# Patient Record
Sex: Female | Born: 1965 | Race: Black or African American | Hispanic: No | Marital: Married | Smoking: Never smoker
Health system: Southern US, Community
[De-identification: ages and names within clinical notes are randomized; demographics above are authoritative.]

## PROBLEM LIST (undated history)

## (undated) DIAGNOSIS — Z973 Presence of spectacles and contact lenses: Secondary | ICD-10-CM

## (undated) HISTORY — PX: WISDOM TOOTH EXTRACTION: SHX21

## (undated) HISTORY — PX: TUBAL LIGATION: SHX77

---

## 2000-06-29 ENCOUNTER — Other Ambulatory Visit: Admission: RE | Admit: 2000-06-29 | Discharge: 2000-06-29 | Payer: Self-pay | Admitting: Obstetrics and Gynecology

## 2002-10-31 ENCOUNTER — Other Ambulatory Visit: Admission: RE | Admit: 2002-10-31 | Discharge: 2002-10-31 | Payer: Self-pay | Admitting: Internal Medicine

## 2005-06-23 ENCOUNTER — Other Ambulatory Visit: Admission: RE | Admit: 2005-06-23 | Discharge: 2005-06-23 | Payer: Self-pay | Admitting: Obstetrics and Gynecology

## 2007-11-06 ENCOUNTER — Emergency Department (HOSPITAL_COMMUNITY): Admission: EM | Admit: 2007-11-06 | Discharge: 2007-11-06 | Payer: Self-pay | Admitting: Emergency Medicine

## 2010-07-19 ENCOUNTER — Encounter: Admission: RE | Admit: 2010-07-19 | Discharge: 2010-07-19 | Payer: Self-pay | Admitting: Family Medicine

## 2010-09-05 ENCOUNTER — Ambulatory Visit (HOSPITAL_COMMUNITY)
Admission: RE | Admit: 2010-09-05 | Discharge: 2010-09-05 | Payer: Self-pay | Source: Home / Self Care | Attending: Chiropractic Medicine | Admitting: Chiropractic Medicine
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
## 2012-02-12 ENCOUNTER — Other Ambulatory Visit: Payer: Self-pay

## 2013-09-20 ENCOUNTER — Encounter (INDEPENDENT_AMBULATORY_CARE_PROVIDER_SITE_OTHER): Payer: Self-pay | Admitting: General Surgery

## 2013-09-20 ENCOUNTER — Encounter (INDEPENDENT_AMBULATORY_CARE_PROVIDER_SITE_OTHER): Payer: Self-pay

## 2013-09-20 ENCOUNTER — Ambulatory Visit (INDEPENDENT_AMBULATORY_CARE_PROVIDER_SITE_OTHER): Payer: 59 | Admitting: General Surgery

## 2013-09-20 VITALS — BP 136/76 | HR 81 | Temp 99.0°F | Resp 18 | Ht 68.0 in | Wt 176.0 lb

## 2013-09-20 DIAGNOSIS — N6310 Unspecified lump in the right breast, unspecified quadrant: Secondary | ICD-10-CM

## 2013-09-20 DIAGNOSIS — N63 Unspecified lump in unspecified breast: Secondary | ICD-10-CM

## 2013-09-20 NOTE — Progress Notes (Signed)
Patient ID: Alexis Gonzales, female   DOB: Jul 05, 1966, 48 y.o.   MRN: [id number]  Chief Complaint  Patient presents with  . New Evaluation    rt breast    HPI Alexis Gonzales is a 48 y.o. female.  Referred by Dr Antonietta Jewel HPI This is a 48 year old female with a significant family history of breast cancer in her mom in her late 62s. In 2013 she was noted to have a right breast mass on her own exam. This was evaluated at that point in time and by ultrasound was 2.5 cm. An ultrasound-guided core needle biopsy was performed at that time. That pathology was benign breast parenchyma with dense hyalinizing stromal fibrosis with no malignancy that was identified. She was then followed up in is noted that this has increased in size on her own exam. Her last ultrasound in January shows this went from 2.2 x 1.2 x 2.5 cm to 3 x 1.5 x 3.8 cm. She has no other complaints except for this mass and comes in to discuss excision today. History reviewed. No pertinent past medical history.  History reviewed. No pertinent past surgical history.  Family History  Problem Relation Age of Onset  . Cancer Mother     breast ca  . Heart disease Father   . Cancer Paternal Uncle     throat ca    Social History History  Substance Use Topics  . Smoking status: Never Smoker   . Smokeless tobacco: Not on file  . Alcohol Use: No    No Known Allergies  No current outpatient prescriptions on file.   No current facility-administered medications for this visit.    Review of Systems Review of Systems  Constitutional: Negative for fever, chills and unexpected weight change.  HENT: Negative for congestion, hearing loss, sore throat, trouble swallowing and voice change.   Eyes: Negative for visual disturbance.  Respiratory: Negative for cough and wheezing.   Cardiovascular: Negative for chest pain, palpitations and leg swelling.  Gastrointestinal: Negative for nausea, vomiting, abdominal pain, diarrhea,  constipation, blood in stool, abdominal distention and anal bleeding.  Genitourinary: Negative for hematuria, vaginal bleeding and difficulty urinating.  Musculoskeletal: Negative for arthralgias.  Skin: Negative for rash and wound.  Neurological: Negative for seizures, syncope and headaches.  Hematological: Negative for adenopathy. Does not bruise/bleed easily.  Psychiatric/Behavioral: Negative for confusion.    Blood pressure 136/76, pulse 81, temperature 99 F (37.2 C), resp. rate 18, height 5\' 8"  (1.727 m), weight 176 lb (79.833 kg), last menstrual period 09/13/2013.  Physical Exam Physical Exam  Vitals reviewed. Constitutional: She appears well-developed and well-nourished.  Neck: Neck supple.  Cardiovascular: Normal rate, regular rhythm and normal heart sounds.   Pulmonary/Chest: Effort normal and breath sounds normal. She has no wheezes. She has no rales. Right breast exhibits mass. Right breast exhibits no inverted nipple, no nipple discharge, no skin change and no tenderness. Left breast exhibits no inverted nipple, no mass, no nipple discharge, no skin change and no tenderness.    Lymphadenopathy:    She has no cervical adenopathy.    She has no axillary adenopathy.       Right: No supraclavicular adenopathy present.       Left: No supraclavicular adenopathy present.    Data Reviewed Mm/us/path reviewed from [facility]  Assessment    Right breast mass     Plan    Right breast mass excision via periareaolar incision  I recommended excision due to her family history  as well as an enlarging right breast mass despite a benign biopsy. I recommended to her proceeding with an excision of this mass. We discussed the risks and benefits associated with that. We're to proceed with this as soon as we can.        Joseluis Alessio 09/20/2013, 3:26 PM

## 2013-09-22 ENCOUNTER — Encounter (HOSPITAL_BASED_OUTPATIENT_CLINIC_OR_DEPARTMENT_OTHER): Payer: Self-pay | Admitting: *Deleted

## 2013-09-22 NOTE — Progress Notes (Signed)
No labs needed

## 2013-09-23 ENCOUNTER — Encounter (INDEPENDENT_AMBULATORY_CARE_PROVIDER_SITE_OTHER): Payer: Self-pay

## 2013-09-26 ENCOUNTER — Telehealth (INDEPENDENT_AMBULATORY_CARE_PROVIDER_SITE_OTHER): Payer: Self-pay

## 2013-09-26 NOTE — Telephone Encounter (Signed)
Error

## 2013-09-27 ENCOUNTER — Other Ambulatory Visit (INDEPENDENT_AMBULATORY_CARE_PROVIDER_SITE_OTHER): Payer: Self-pay | Admitting: General Surgery

## 2013-09-27 ENCOUNTER — Encounter (HOSPITAL_BASED_OUTPATIENT_CLINIC_OR_DEPARTMENT_OTHER): Payer: Self-pay | Admitting: *Deleted

## 2013-09-27 ENCOUNTER — Encounter (HOSPITAL_BASED_OUTPATIENT_CLINIC_OR_DEPARTMENT_OTHER): Payer: 59 | Admitting: Anesthesiology

## 2013-09-27 ENCOUNTER — Ambulatory Visit (HOSPITAL_BASED_OUTPATIENT_CLINIC_OR_DEPARTMENT_OTHER): Payer: 59 | Admitting: Anesthesiology

## 2013-09-27 ENCOUNTER — Encounter (HOSPITAL_BASED_OUTPATIENT_CLINIC_OR_DEPARTMENT_OTHER)
Admission: RE | Disposition: A | Discharge status: Home / Self Care | Payer: Self-pay | Source: Ambulatory Visit | Attending: General Surgery

## 2013-09-27 ENCOUNTER — Ambulatory Visit (HOSPITAL_BASED_OUTPATIENT_CLINIC_OR_DEPARTMENT_OTHER)
Admission: RE | Admit: 2013-09-27 | Discharge: 2013-09-27 | Disposition: A | Discharge status: Home / Self Care | Payer: 59 | Source: Ambulatory Visit | Attending: General Surgery | Admitting: General Surgery

## 2013-09-27 DIAGNOSIS — R920 Mammographic microcalcification found on diagnostic imaging of breast: Secondary | ICD-10-CM

## 2013-09-27 DIAGNOSIS — N63 Unspecified lump in unspecified breast: Secondary | ICD-10-CM | POA: Insufficient documentation

## 2013-09-27 HISTORY — DX: Presence of spectacles and contact lenses: Z97.3

## 2013-09-27 HISTORY — PX: BREAST BIOPSY: SHX20

## 2013-09-27 LAB — POCT HEMOGLOBIN-HEMACUE: Hemoglobin: 12.2 g/dL (ref 12.0–15.0)

## 2013-09-27 SURGERY — BREAST BIOPSY
Anesthesia: General | Site: Breast | Laterality: Right

## 2013-09-27 MED ORDER — ONDANSETRON HCL 4 MG/2ML IJ SOLN
INTRAMUSCULAR | Status: DC | PRN
  Administered 2013-09-27: 4 mg via INTRAVENOUS

## 2013-09-27 MED ORDER — OXYCODONE HCL 5 MG PO TABS
5.0000 mg | ORAL_TABLET | Freq: Once | ORAL | Status: AC | PRN
Start: 2013-09-27 — End: 2013-09-27
  Administered 2013-09-27: 5 mg via ORAL
  Filled 2013-09-27: qty 1

## 2013-09-27 MED ORDER — HYDROMORPHONE HCL PF 1 MG/ML IJ SOLN
0.2500 mg | INTRAMUSCULAR | Status: DC | PRN
Start: 2013-09-27 — End: 2013-09-27
  Administered 2013-09-27: 0.5 mg via INTRAVENOUS

## 2013-09-27 MED ORDER — MIDAZOLAM HCL 2 MG/2ML IJ SOLN: 1.0000 mg | INTRAMUSCULAR | Status: DC | PRN

## 2013-09-27 MED ORDER — LACTATED RINGERS IV SOLN
INTRAVENOUS | Status: DC
  Administered 2013-09-27 (×2): via INTRAVENOUS

## 2013-09-27 MED ORDER — MIDAZOLAM HCL 2 MG/2ML IJ SOLN
INTRAMUSCULAR | Status: AC
  Filled 2013-09-27: qty 2

## 2013-09-27 MED ORDER — LIDOCAINE HCL (CARDIAC) 20 MG/ML IV SOLN
INTRAVENOUS | Status: DC | PRN
  Administered 2013-09-27: 100 mg via INTRAVENOUS

## 2013-09-27 MED ORDER — FENTANYL CITRATE 0.05 MG/ML IJ SOLN: 50.0000 ug | INTRAMUSCULAR | Status: DC | PRN

## 2013-09-27 MED ORDER — PROMETHAZINE HCL 25 MG/ML IJ SOLN
6.2500 mg | INTRAMUSCULAR | Status: DC | PRN
Start: 2013-09-27 — End: 2013-09-27

## 2013-09-27 MED ORDER — MIDAZOLAM HCL 5 MG/5ML IJ SOLN
INTRAMUSCULAR | Status: DC | PRN
  Administered 2013-09-27: 2 mg via INTRAVENOUS

## 2013-09-27 MED ORDER — CEFAZOLIN SODIUM-DEXTROSE 2-3 GM-% IV SOLR
INTRAVENOUS | Status: AC
  Filled 2013-09-27: qty 50

## 2013-09-27 MED ORDER — HYDROMORPHONE HCL PF 1 MG/ML IJ SOLN
INTRAMUSCULAR | Status: AC
  Filled 2013-09-27: qty 1

## 2013-09-27 MED ORDER — PROPOFOL 10 MG/ML IV BOLUS
INTRAVENOUS | Status: AC
  Filled 2013-09-27: qty 20

## 2013-09-27 MED ORDER — OXYCODONE-ACETAMINOPHEN 10-325 MG PO TABS: 1.0000 | ORAL_TABLET | Freq: Four times a day (QID) | ORAL | Status: AC | PRN

## 2013-09-27 MED ORDER — FENTANYL CITRATE 0.05 MG/ML IJ SOLN
INTRAMUSCULAR | Status: AC
  Filled 2013-09-27: qty 4

## 2013-09-27 MED ORDER — DEXAMETHASONE SODIUM PHOSPHATE 4 MG/ML IJ SOLN
INTRAMUSCULAR | Status: DC | PRN
  Administered 2013-09-27: 10 mg via INTRAVENOUS

## 2013-09-27 MED ORDER — BUPIVACAINE HCL (PF) 0.25 % IJ SOLN
INTRAMUSCULAR | Status: DC | PRN
  Administered 2013-09-27: 10 mL

## 2013-09-27 MED ORDER — CEFAZOLIN SODIUM-DEXTROSE 2-3 GM-% IV SOLR
2.0000 g | INTRAVENOUS | Status: AC
  Administered 2013-09-27: 2 g via INTRAVENOUS

## 2013-09-27 MED ORDER — FENTANYL CITRATE 0.05 MG/ML IJ SOLN: 50.0000 ug | Freq: Once | INTRAMUSCULAR | Status: DC

## 2013-09-27 MED ORDER — OXYCODONE HCL 5 MG/5ML PO SOLN: 5.0000 mg | Freq: Once | ORAL | Status: AC | PRN

## 2013-09-27 MED ORDER — FENTANYL CITRATE 0.05 MG/ML IJ SOLN
INTRAMUSCULAR | Status: DC | PRN
  Administered 2013-09-27: 50 ug via INTRAVENOUS

## 2013-09-27 MED ORDER — PROPOFOL 10 MG/ML IV BOLUS
INTRAVENOUS | Status: DC | PRN
  Administered 2013-09-27: 180 mg via INTRAVENOUS

## 2013-09-27 SURGICAL SUPPLY — 49 items
APPLIER CLIP 9.375 MED OPEN (MISCELLANEOUS)
BENZOIN TINCTURE PRP APPL 2/3 (GAUZE/BANDAGES/DRESSINGS) IMPLANT
BINDER BREAST LRG (GAUZE/BANDAGES/DRESSINGS) ×2 IMPLANT
BINDER BREAST MEDIUM (GAUZE/BANDAGES/DRESSINGS) IMPLANT
BINDER BREAST XLRG (GAUZE/BANDAGES/DRESSINGS) IMPLANT
BINDER BREAST XXLRG (GAUZE/BANDAGES/DRESSINGS) IMPLANT
BLADE SURG 15 STRL LF DISP TIS (BLADE) ×1 IMPLANT
BLADE SURG 15 STRL SS (BLADE) ×1
CANISTER SUCT 1200ML W/VALVE (MISCELLANEOUS) IMPLANT
CHLORAPREP W/TINT 26ML (MISCELLANEOUS) ×2 IMPLANT
CLIP APPLIE 9.375 MED OPEN (MISCELLANEOUS) IMPLANT
COVER MAYO STAND STRL (DRAPES) ×2 IMPLANT
COVER TABLE BACK 60X90 (DRAPES) ×2 IMPLANT
DECANTER SPIKE VIAL GLASS SM (MISCELLANEOUS) IMPLANT
DERMABOND ADVANCED (GAUZE/BANDAGES/DRESSINGS) ×1
DERMABOND ADVANCED .7 DNX12 (GAUZE/BANDAGES/DRESSINGS) ×1 IMPLANT
DEVICE DUBIN W/COMP PLATE 8390 (MISCELLANEOUS) IMPLANT
DRAPE PED LAPAROTOMY (DRAPES) ×2 IMPLANT
DRSG TEGADERM 4X4.75 (GAUZE/BANDAGES/DRESSINGS) ×2 IMPLANT
ELECT COATED BLADE 2.86 ST (ELECTRODE) ×2 IMPLANT
ELECT REM PT RETURN 9FT ADLT (ELECTROSURGICAL) ×2
ELECTRODE REM PT RTRN 9FT ADLT (ELECTROSURGICAL) ×1 IMPLANT
GLOVE BIO SURGEON STRL SZ7 (GLOVE) ×2 IMPLANT
GLOVE BIOGEL PI IND STRL 7.5 (GLOVE) ×1 IMPLANT
GLOVE BIOGEL PI INDICATOR 7.5 (GLOVE) ×1
GOWN STRL REUS W/ TWL LRG LVL3 (GOWN DISPOSABLE) ×2 IMPLANT
GOWN STRL REUS W/TWL LRG LVL3 (GOWN DISPOSABLE) ×2
NEEDLE HYPO 25X1 1.5 SAFETY (NEEDLE) ×2 IMPLANT
NS IRRIG 1000ML POUR BTL (IV SOLUTION) IMPLANT
PACK BASIN DAY SURGERY FS (CUSTOM PROCEDURE TRAY) ×2 IMPLANT
PENCIL BUTTON HOLSTER BLD 10FT (ELECTRODE) ×2 IMPLANT
SLEEVE SCD COMPRESS KNEE MED (MISCELLANEOUS) ×2 IMPLANT
SPONGE GAUZE 4X4 12PLY STER LF (GAUZE/BANDAGES/DRESSINGS) ×2 IMPLANT
SPONGE LAP 4X18 X RAY DECT (DISPOSABLE) ×2 IMPLANT
STRIP CLOSURE SKIN 1/2X4 (GAUZE/BANDAGES/DRESSINGS) ×2 IMPLANT
SUT MNCRL AB 3-0 PS2 18 (SUTURE) IMPLANT
SUT MNCRL AB 4-0 PS2 18 (SUTURE) IMPLANT
SUT SILK 2 0 SH (SUTURE) ×2 IMPLANT
SUT VIC AB 2-0 SH 27 (SUTURE) ×1
SUT VIC AB 2-0 SH 27XBRD (SUTURE) ×1 IMPLANT
SUT VIC AB 3-0 SH 27 (SUTURE) ×1
SUT VIC AB 3-0 SH 27X BRD (SUTURE) ×1 IMPLANT
SUT VIC AB 5-0 PS2 18 (SUTURE) IMPLANT
SUT VICRYL AB 3 0 TIES (SUTURE) IMPLANT
SYR CONTROL 10ML LL (SYRINGE) ×2 IMPLANT
TOWEL OR 17X24 6PK STRL BLUE (TOWEL DISPOSABLE) ×2 IMPLANT
TOWEL OR NON WOVEN STRL DISP B (DISPOSABLE) ×2 IMPLANT
TUBE CONNECTING 20X1/4 (TUBING) IMPLANT
YANKAUER SUCT BULB TIP NO VENT (SUCTIONS) IMPLANT

## 2013-09-27 NOTE — Interval H&P Note (Signed)
History and Physical Interval Note:  09/27/2013 1:59 PM  Alexis Gonzales  has presented today for surgery, with the diagnosis of RIGHT BREAST MASS  The various methods of treatment have been discussed with the patient and family. After consideration of risks, benefits and other options for treatment, the patient has consented to  Procedure(s): RIGHT BREAST MASS EXCISION (Right) as a surgical intervention .  The patient's history has been reviewed, patient examined, no change in status, stable for surgery.  I have reviewed the patient's chart and labs.  Questions were answered to the patient's satisfaction.     [name]

## 2013-09-27 NOTE — Discharge Instructions (Signed)
[facility] °Office Phone Number [phone] ° °BREAST BIOPSY/ PARTIAL MASTECTOMY: POST OP INSTRUCTIONS ° °Always review your discharge instruction sheet given to you by the facility where your surgery was performed. ° °IF YOU HAVE DISABILITY OR FAMILY LEAVE FORMS, YOU MUST BRING THEM TO THE OFFICE FOR PROCESSING.  DO NOT GIVE THEM TO YOUR DOCTOR. ° °1. A prescription for pain medication may be given to you upon discharge.  Take your pain medication as prescribed, if needed.  If narcotic pain medicine is not needed, then you may take acetaminophen (Tylenol), naprosyn (Alleve) or ibuprofen (Advil) as needed. °2. Take your usually prescribed medications unless otherwise directed °3. If you need a refill on your pain medication, please contact your pharmacy.  They will contact our office to request authorization.  Prescriptions will not be filled after 5pm or on week-ends. °4. You should eat very light the first 24 hours after surgery, such as soup, crackers, pudding, etc.  Resume your normal diet the day after surgery. °5. Most patients will experience some swelling and bruising in the breast.  Ice packs and a good support bra will help.  Wear the breast binder provided or a sports bra for 72 hours day and night.  After that wear a sports bra during the day until you return to the office. Swelling and bruising can take several days to resolve.  °6. It is common to experience some constipation if taking pain medication after surgery.  Increasing fluid intake and taking a stool softener will usually help or prevent this problem from occurring.  A mild laxative (Milk of Magnesia or Miralax) should be taken according to package directions if there are no bowel movements after 48 hours. °7. Unless discharge instructions indicate otherwise, you may remove your bandages 48 hours after surgery and you may shower at that time.  You may have steri-strips (small skin tapes) in place directly over the incision.   These strips should be left on the skin for 7-10 days and will come off on their own.  If your surgeon used skin glue on the incision, you may shower in 24 hours.  The glue will flake off over the next 2-3 weeks.  Any sutures or staples will be removed at the office during your follow-up visit. °8. ACTIVITIES:  You may resume regular daily activities (gradually increasing) beginning the next day.  Wearing a good support bra or sports bra minimizes pain and swelling.  You may have sexual intercourse when it is comfortable. °a. You may drive when you no longer are taking prescription pain medication, you can comfortably wear a seatbelt, and you can safely maneuver your car and apply brakes. °b. RETURN TO WORK:  ______________________________________________________________________________________ °9. You should see your doctor in the office for a follow-up appointment approximately two weeks after your surgery.  Your doctor’s nurse will typically make your follow-up appointment when she calls you with your pathology report.  Expect your pathology report 3-4 business days after your surgery.  You may call to check if you do not hear from us after three days. °10. OTHER INSTRUCTIONS: _______________________________________________________________________________________________ _____________________________________________________________________________________________________________________________________ °_____________________________________________________________________________________________________________________________________ °_____________________________________________________________________________________________________________________________________ ° °WHEN TO CALL DR WAKEFIELD: °1. Fever over 101.0 °2. Nausea and/or vomiting. °3. Extreme swelling or bruising. °4. Continued bleeding from incision. °5. Increased pain, redness, or drainage from the incision. ° °The clinic staff is available to  answer your questions during regular business hours.  Please don’t hesitate to call and ask to speak to one of the nurses for   clinical concerns.  If you have a medical emergency, go to the nearest emergency room or call 911.  A surgeon from [facility] is always on call at the hospital. ° °For further questions, please visit [email or web address] ° °Post Anesthesia Home Care Instructions ° °Activity: °Get plenty of rest for the remainder of the day. A responsible adult should stay with you for 24 hours following the procedure.  °For the next 24 hours, DO NOT: °-Drive a car °-Operate machinery °-Drink alcoholic beverages °-Take any medication unless instructed by your physician °-Make any legal decisions or sign important papers. ° °Meals: °Start with liquid foods such as gelatin or soup. Progress to regular foods as tolerated. Avoid greasy, spicy, heavy foods. If nausea and/or vomiting occur, drink only clear liquids until the nausea and/or vomiting subsides. Call your physician if vomiting continues. ° °Special Instructions/Symptoms: °Your throat may feel dry or sore from the anesthesia or the breathing tube placed in your throat during surgery. If this causes discomfort, gargle with warm salt water. The discomfort should disappear within 24 hours. ° °

## 2013-09-27 NOTE — Anesthesia Preprocedure Evaluation (Signed)
Anesthesia Evaluation  Patient identified by MRN, date of birth, ID band Patient awake    Reviewed: Allergy & Precautions, H&P , NPO status , Patient's Chart, lab work & pertinent test results  Airway Mallampati: I TM Distance: >3 FB     Dental   Pulmonary  breath sounds clear to auscultation        Cardiovascular Rhythm:Regular Rate:Normal     Neuro/Psych    GI/Hepatic   Endo/Other    Renal/GU      Musculoskeletal   Abdominal   Peds  Hematology   Anesthesia Other Findings   Reproductive/Obstetrics                           Anesthesia Physical Anesthesia Plan  ASA: I  Anesthesia Plan: General   Post-op Pain Management:    Induction: Intravenous  Airway Management Planned: LMA  Additional Equipment:   Intra-op Plan:   Post-operative Plan: Extubation in OR  Informed Consent: I have reviewed the patients History and Physical, chart, labs and discussed the procedure including the risks, benefits and alternatives for the proposed anesthesia with the patient or authorized representative who has indicated his/her understanding and acceptance.     Plan Discussed with: CRNA and Surgeon  Anesthesia Plan Comments:         Anesthesia Quick Evaluation

## 2013-09-27 NOTE — H&P (View-Only) (Signed)
Patient ID: Alexis Gonzales, female   DOB: Jul 05, 1966, 48 y.o.   MRN: [id number]  Chief Complaint  Patient presents with  . New Evaluation    rt breast    HPI Alexis Gonzales is a 48 y.o. female.  Referred by Dr Antonietta Jewel HPI This is a 48 year old female with a significant family history of breast cancer in her mom in her late 62s. In 2013 she was noted to have a right breast mass on her own exam. This was evaluated at that point in time and by ultrasound was 2.5 cm. An ultrasound-guided core needle biopsy was performed at that time. That pathology was benign breast parenchyma with dense hyalinizing stromal fibrosis with no malignancy that was identified. She was then followed up in is noted that this has increased in size on her own exam. Her last ultrasound in January shows this went from 2.2 x 1.2 x 2.5 cm to 3 x 1.5 x 3.8 cm. She has no other complaints except for this mass and comes in to discuss excision today. History reviewed. No pertinent past medical history.  History reviewed. No pertinent past surgical history.  Family History  Problem Relation Age of Onset  . Cancer Mother     breast ca  . Heart disease Father   . Cancer Paternal Uncle     throat ca    Social History History  Substance Use Topics  . Smoking status: Never Smoker   . Smokeless tobacco: Not on file  . Alcohol Use: No    No Known Allergies  No current outpatient prescriptions on file.   No current facility-administered medications for this visit.    Review of Systems Review of Systems  Constitutional: Negative for fever, chills and unexpected weight change.  HENT: Negative for congestion, hearing loss, sore throat, trouble swallowing and voice change.   Eyes: Negative for visual disturbance.  Respiratory: Negative for cough and wheezing.   Cardiovascular: Negative for chest pain, palpitations and leg swelling.  Gastrointestinal: Negative for nausea, vomiting, abdominal pain, diarrhea,  constipation, blood in stool, abdominal distention and anal bleeding.  Genitourinary: Negative for hematuria, vaginal bleeding and difficulty urinating.  Musculoskeletal: Negative for arthralgias.  Skin: Negative for rash and wound.  Neurological: Negative for seizures, syncope and headaches.  Hematological: Negative for adenopathy. Does not bruise/bleed easily.  Psychiatric/Behavioral: Negative for confusion.    Blood pressure 136/76, pulse 81, temperature 99 F (37.2 C), resp. rate 18, height 5\' 8"  (1.727 m), weight 176 lb (79.833 kg), last menstrual period 09/13/2013.  Physical Exam Physical Exam  Vitals reviewed. Constitutional: She appears well-developed and well-nourished.  Neck: Neck supple.  Cardiovascular: Normal rate, regular rhythm and normal heart sounds.   Pulmonary/Chest: Effort normal and breath sounds normal. She has no wheezes. She has no rales. Right breast exhibits mass. Right breast exhibits no inverted nipple, no nipple discharge, no skin change and no tenderness. Left breast exhibits no inverted nipple, no mass, no nipple discharge, no skin change and no tenderness.    Lymphadenopathy:    She has no cervical adenopathy.    She has no axillary adenopathy.       Right: No supraclavicular adenopathy present.       Left: No supraclavicular adenopathy present.    Data Reviewed Mm/us/path reviewed from [facility]  Assessment    Right breast mass     Plan    Right breast mass excision via periareaolar incision  I recommended excision due to her family history  as well as an enlarging right breast mass despite a benign biopsy. I recommended to her proceeding with an excision of this mass. We discussed the risks and benefits associated with that. We're to proceed with this as soon as we can.        Rashawn Rayman 09/20/2013, 3:26 PM

## 2013-09-27 NOTE — Transfer of Care (Signed)
Immediate Anesthesia Transfer of Care Note  Patient: Alexis Gonzales  Procedure(s) Performed: Procedure(s): RIGHT BREAST MASS EXCISION (Right)  Patient Location: PACU  Anesthesia Type:General  Level of Consciousness: awake and alert   Airway & Oxygen Therapy: Patient Spontanous Breathing and Patient connected to face mask oxygen  Post-op Assessment: Report given to PACU RN and Post -op Vital signs reviewed and stable  Post vital signs: Reviewed and stable  Complications: No apparent anesthesia complications

## 2013-09-27 NOTE — Anesthesia Postprocedure Evaluation (Signed)
  Anesthesia Post-op Note  Patient: Alexis Gonzales  Procedure(s) Performed: Procedure(s): RIGHT BREAST MASS EXCISION (Right)  Patient Location: PACU  Anesthesia Type:General  Level of Consciousness: awake  Airway and Oxygen Therapy: Patient Spontanous Breathing  Post-op Pain: mild  Post-op Assessment: Post-op Vital signs reviewed, Patient's Cardiovascular Status Stable, Respiratory Function Stable, Patent Airway, No signs of Nausea or vomiting and Pain level controlled  Post-op Vital Signs: Reviewed and stable  Complications: No apparent anesthesia complications

## 2013-09-27 NOTE — Anesthesia Procedure Notes (Signed)
Procedure Name: LMA Insertion Date/Time: 09/27/2013 2:17 PM Performed by: Gar GibbonKEETON, Aneliese Beaudry S Pre-anesthesia Checklist: Patient identified, Emergency Drugs available, Suction available and Patient being monitored Patient Re-evaluated:Patient Re-evaluated prior to inductionOxygen Delivery Method: Circle System Utilized Preoxygenation: Pre-oxygenation with 100% oxygen Intubation Type: IV induction Ventilation: Mask ventilation without difficulty LMA: LMA inserted LMA Size: 4.0 Number of attempts: 1 Airway Equipment and Method: bite block Placement Confirmation: positive ETCO2 Tube secured with: Tape Dental Injury: Teeth and Oropharynx as per pre-operative assessment

## 2013-09-27 NOTE — Op Note (Signed)
Preoperative diagnosis: right breast mass, enlarging Postoperative diagnosis: same as above Procedure: right breast mass excision Surgeon: Alexis Gonzales Anesthesia: general Estimated blood loss: Minimal Complications: None Drains: None Specimens: Right breast tissue more short stitch superior, long stitch lateral, double stitch deep Disposition to recovery stable Sponge and needle count correct at completion  Indication: This is a [age] yof with an enlarging right breast mass. This has enlarged by ultrasound as well as by clinical exam. She has a prior benign biopsy but we discussed excision due to its enlargement.  Procedure: After informed consent was obtained the patient was taken to the operating room. She was given cefazolin. Sequential compression devices were on her legs. She was placed under general anesthesia with an LMA. Her right breast was prepped and draped in the standard sterile surgical fashion. A surgical timeout was then performed.  I made a perareolar incision. I used cautery to dissect up towards the mass. 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 This separated from the surrounding breast tissue fairly easily. I then removed the mass in total. I then marked this with sutures as above. Hemostasis was then obtained. I then closed the breast tissue a 2-0 Vicryl. The dermis was closed with 3-0 Vicryl. The skin was closed with 5-0 Monocryl. Quarter percent Marcaine was infiltrated throughout the area. I then placed Dermabond and Steri-Strips. She tolerated this well was extubated and transferred to recovery stable.

## 2013-09-29 ENCOUNTER — Encounter (HOSPITAL_BASED_OUTPATIENT_CLINIC_OR_DEPARTMENT_OTHER): Payer: Self-pay | Admitting: General Surgery

## 2013-09-29 ENCOUNTER — Encounter (INDEPENDENT_AMBULATORY_CARE_PROVIDER_SITE_OTHER): Payer: Self-pay | Admitting: General Surgery

## 2013-09-30 ENCOUNTER — Telehealth (INDEPENDENT_AMBULATORY_CARE_PROVIDER_SITE_OTHER): Payer: Self-pay

## 2013-09-30 NOTE — Telephone Encounter (Signed)
Called pt to notify her the path report is benign per Dr Dwain SarnaWakefield. I made the pt a f/u appt with Dr Dwain SarnaWakefield for 10/18/13. The pt understands.

## 2013-10-18 ENCOUNTER — Encounter (INDEPENDENT_AMBULATORY_CARE_PROVIDER_SITE_OTHER): Payer: Self-pay | Admitting: General Surgery

## 2013-10-18 ENCOUNTER — Ambulatory Visit (INDEPENDENT_AMBULATORY_CARE_PROVIDER_SITE_OTHER): Payer: 59 | Admitting: General Surgery

## 2013-10-18 DIAGNOSIS — Z09 Encounter for follow-up examination after completed treatment for conditions other than malignant neoplasm: Secondary | ICD-10-CM

## 2013-10-18 NOTE — Patient Instructions (Signed)

## 2013-10-18 NOTE — Progress Notes (Signed)
Subjective:     Patient ID: Alexis Gonzales, female   DOB: 12-24-1965, 48 y.o.   MRN: [id number]  HPI [age] yof who had palpable breast mass that underwent excision recently. She has done well and has no complaints today.  The biopsy is benign, no atypia or malignancy.  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    Review of Systems     Objective:   Physical Exam Healing right breast periareolar incision without infection    Assessment:     S/p benign right breast biopsy    Plan:     She can return to full activity.  We discussed pathology. She will continue monthly self exams, yearly mm and yearly clinical exam.  I will see her back as needed

## 2014-05-26 ENCOUNTER — Encounter (HOSPITAL_COMMUNITY): Payer: Self-pay | Admitting: Emergency Medicine

## 2014-05-26 ENCOUNTER — Emergency Department (HOSPITAL_COMMUNITY)
Admission: EM | Admit: 2014-05-26 | Discharge: 2014-05-26 | Disposition: A | Discharge status: Home / Self Care | Payer: 59 | Source: Home / Self Care | Attending: Family Medicine | Admitting: Family Medicine

## 2014-05-26 DIAGNOSIS — N390 Urinary tract infection, site not specified: Secondary | ICD-10-CM

## 2014-05-26 LAB — POCT URINALYSIS DIP (DEVICE)
Bilirubin Urine: NEGATIVE
Glucose, UA: NEGATIVE mg/dL
Ketones, ur: NEGATIVE mg/dL
Nitrite: POSITIVE — AB
Protein, ur: 100 mg/dL — AB
Specific Gravity, Urine: 1.015 (ref 1.005–1.030)
Urobilinogen, UA: 1 mg/dL (ref 0.0–1.0)
pH: 8.5 — ABNORMAL HIGH (ref 5.0–8.0)

## 2014-05-26 MED ORDER — SULFAMETHOXAZOLE-TRIMETHOPRIM 800-160 MG PO TABS: 1.0000 | ORAL_TABLET | Freq: Two times a day (BID) | ORAL | Status: DC

## 2014-05-26 NOTE — ED Provider Notes (Signed)
CSN: [id number]     Arrival date & time 05/26/14  1552 History   First MD Initiated Contact with Patient 05/26/14 1646     Chief Complaint  Patient presents with  . Urinary Tract Infection   (Consider location/radiation/quality/duration/timing/severity/associated sxs/prior Treatment) HPI Comments: 24 hours of urinary frequency and suprapubic pressure. No vaginal bleeding, discharge, fever, N/V, flank pain or abdominal pain.  Patient is a 48 y.o. female presenting with urinary tract infection. The history is provided by the patient.  Urinary Tract Infection This is a new problem. The current episode started yesterday. The problem occurs constantly. The problem has not changed since onset.   Past Medical History  Diagnosis Date  . Wears glasses    Past Surgical History  Procedure Laterality Date  . Wisdom tooth extraction    . Tubal ligation    . Breast biopsy Right 09/27/2013    Procedure: RIGHT BREAST MASS EXCISION;  Surgeon: Emelia Loron, MD;  Location: [facility];  Service: General;  Laterality: Right;  . Tubal ligation     Family History  Problem Relation Age of Onset  . Cancer Mother     breast ca  . Heart disease Father   . Cancer Paternal Uncle     throat ca   History  Substance Use Topics  . Smoking status: Never Smoker   . Smokeless tobacco: Not on file  . Alcohol Use: Yes     Comment: occ   OB History   Grav Para Term Preterm Abortions TAB SAB Ect Mult Living                 Review of Systems  All other systems reviewed and are negative.   Allergies  Review of patient's allergies indicates no known allergies.  Home Medications   Prior to Admission medications   Medication Sig Start Date End Date Taking? Authorizing Provider  calcium carbonate (OS-CAL - DOSED IN MG OF ELEMENTAL CALCIUM) 1250 MG tablet Take 1 tablet by mouth.    Historical Provider, MD  Multiple Vitamins-Minerals (MULTIVITAMIN WITH MINERALS) tablet Take 1 tablet by  mouth daily.    Historical Provider, MD  oxyCODONE-acetaminophen (PERCOCET) 10-325 MG per tablet Take 1 tablet by mouth every 6 (six) hours as needed for pain. 09/27/13 09/27/14  Emelia Loron, MD  sulfamethoxazole-trimethoprim (SEPTRA DS) 800-160 MG per tablet Take 1 tablet by mouth every 12 (twelve) hours. X 5 days 05/26/14   Jess Barters H Kaidan Spengler, PA   BP 134/84  Pulse 83  Temp(Src) 98.7 F (37.1 C)  SpO2 100% Physical Exam  Nursing note and vitals reviewed. Constitutional: She is oriented to person, place, and time. She appears well-developed and well-nourished. No distress.  HENT:  Head: Normocephalic and atraumatic.  Eyes: Conjunctivae are normal. No scleral icterus.  Cardiovascular: Normal rate, regular rhythm and normal heart sounds.   Pulmonary/Chest: Effort normal and breath sounds normal. No respiratory distress. She has no wheezes.  Abdominal: Soft. Bowel sounds are normal. She exhibits no distension. There is no tenderness. There is no CVA tenderness.  Musculoskeletal: Normal range of motion.  Neurological: She is alert and oriented to person, place, and time.  Skin: Skin is warm and dry. No rash noted. No erythema.  Psychiatric: She has a normal mood and affect. Her behavior is normal.    ED Course  Procedures (including critical care time) Labs Review Labs Reviewed  POCT URINALYSIS DIP (DEVICE) - Abnormal; Notable for the following:    Hgb urine  dipstick SMALL (*)    pH 8.5 (*)    Protein, ur 100 (*)    Nitrite POSITIVE (*)    Leukocytes, UA LARGE (*)    All other components within normal limits  URINE CULTURE    Imaging Review No results found.   MDM   1. UTI (lower urinary tract infection)    UA consistent with UTI. Will send specimen for C&S. Patient states that when she had UTI with similar symptom about 2 years ago she was treated (with success) with 5 day course of TMP/SMX and she wishes to be treated with this medication for current  episode.     Ria Clock, [place] 05/26/14 1705

## 2014-05-26 NOTE — ED Notes (Signed)
Frequency,urgency, pain w urination today onset; denies abdominal pain. "Feels like another UTI"

## 2014-05-27 NOTE — ED Provider Notes (Signed)
Medical screening examination/treatment/procedure(s) were performed by resident physician or non-physician practitioner and as supervising physician I was immediately available for consultation/collaboration.   Demi Trieu DOUGLAS MD.   Haydee Jabbour D Stokely Jeancharles, MD 05/27/14 0951 

## 2014-05-28 LAB — URINE CULTURE
Colony Count: >=100000
Special Requests: NORMAL

## 2016-02-28 ENCOUNTER — Encounter (HOSPITAL_COMMUNITY): Payer: Self-pay | Admitting: Emergency Medicine

## 2016-02-28 ENCOUNTER — Ambulatory Visit (HOSPITAL_COMMUNITY)
Admission: EM | Admit: 2016-02-28 | Discharge: 2016-02-28 | Disposition: A | Discharge status: Home / Self Care | Payer: 59 | Attending: Emergency Medicine | Admitting: Emergency Medicine

## 2016-02-28 DIAGNOSIS — N898 Other specified noninflammatory disorders of vagina: Secondary | ICD-10-CM | POA: Diagnosis present

## 2016-02-28 DIAGNOSIS — Z87440 Personal history of urinary (tract) infections: Secondary | ICD-10-CM | POA: Insufficient documentation

## 2016-02-28 DIAGNOSIS — Z79899 Other long term (current) drug therapy: Secondary | ICD-10-CM | POA: Diagnosis not present

## 2016-02-28 DIAGNOSIS — Z9889 Other specified postprocedural states: Secondary | ICD-10-CM | POA: Diagnosis not present

## 2016-02-28 LAB — POCT URINALYSIS DIP (DEVICE)
Bilirubin Urine: NEGATIVE
Glucose, UA: NEGATIVE mg/dL
Ketones, ur: NEGATIVE mg/dL
Nitrite: NEGATIVE
Protein, ur: NEGATIVE mg/dL
Specific Gravity, Urine: 1.025 (ref 1.005–1.030)
Urobilinogen, UA: 0.2 mg/dL (ref 0.0–1.0)
pH: 6.5 (ref 5.0–8.0)

## 2016-02-28 MED ORDER — METRONIDAZOLE 500 MG PO TABS: 500.0000 mg | ORAL_TABLET | Freq: Two times a day (BID) | ORAL | Status: DC

## 2016-02-28 MED ORDER — FLUCONAZOLE 150 MG PO TABS: 150.0000 mg | ORAL_TABLET | Freq: Once | ORAL | Status: DC

## 2016-02-28 NOTE — ED Notes (Signed)
Patient reports vaginal discharge for 2 days. Reports slight sting or burn with urination.  No menstrual cycle in one year.

## 2016-02-28 NOTE — ED Provider Notes (Signed)
CSN: [id number]     Arrival date & time 02/28/16  1801 History   First MD Initiated Contact with Patient 02/28/16 1822     Chief Complaint  Patient presents with  . Vaginal Discharge   (Consider location/radiation/quality/duration/timing/severity/associated sxs/prior Treatment) HPI  She is a 50 year old woman here for evaluation of vaginal discharge. She states this started 2 days ago with a slightly chunky white discharge. She also reports some mild vaginal itching. In the last day she has started feeling some irritation. She reports slight burning with urination. No abdominal pain. No urinary frequency or urgency. No hematuria. No fevers or chills. No new sexual partners. She has not had a menstrual period for 1 year. She states she does have a history of UTIs, but this doesn't really feel like one. No new lotions or soaps.  Past Medical History  Diagnosis Date  . Wears glasses    Past Surgical History  Procedure Laterality Date  . Wisdom tooth extraction    . Tubal ligation    . Breast biopsy Right 09/27/2013    Procedure: RIGHT BREAST MASS EXCISION;  Surgeon: Emelia LoronMatthew Wakefield, MD;  Location: [facility];  Service: General;  Laterality: Right;  . Tubal ligation     Family History  Problem Relation Age of Onset  . Cancer Mother     breast ca  . Heart disease Father   . Cancer Paternal Uncle     throat ca   Social History  Substance Use Topics  . Smoking status: Never Smoker   . Smokeless tobacco: None  . Alcohol Use: Yes     Comment: occ   OB History    No data available     Review of Systems As in history of present illness Allergies  Review of patient's allergies indicates no known allergies.  Home Medications   Prior to Admission medications   Medication Sig Start Date End Date Taking? Authorizing Provider  calcium carbonate (OS-CAL - DOSED IN MG OF ELEMENTAL CALCIUM) 1250 MG tablet Take 1 tablet by mouth.    Historical Provider, MD  fluconazole  (DIFLUCAN) 150 MG tablet Take 1 tablet (150 mg total) by mouth once. Take 2nd after completing Flagyl 02/28/16   Charm RingsErin J Narissa Beaufort, MD  metroNIDAZOLE (FLAGYL) 500 MG tablet Take 1 tablet (500 mg total) by mouth 2 (two) times daily. 02/28/16   Charm RingsErin J Gabreille Dardis, MD  Multiple Vitamins-Minerals (MULTIVITAMIN WITH MINERALS) tablet Take 1 tablet by mouth daily.    Historical Provider, MD   Meds Ordered and Administered this Visit  Medications - No data to display  BP 155/63 mmHg  Pulse 85  Temp(Src) 98 F (36.7 C) (Oral)  SpO2 100%  LMP 02/28/2015 No data found.   Physical Exam  Constitutional: She is oriented to person, place, and time. She appears well-developed and well-nourished. No distress.  Cardiovascular: Normal rate.   Pulmonary/Chest: Effort normal.  Genitourinary: There is no rash on the right labia. There is no rash on the left labia. Cervix exhibits friability (Slight friability). Cervix exhibits no motion tenderness and no discharge. No bleeding in the vagina. No foreign body around the vagina. Vaginal discharge (white to yellow and frothy) found.  Mild irritation of labia  Neurological: She is alert and oriented to person, place, and time.    ED Course  Procedures (including critical care time)  Labs Review Labs Reviewed  POCT URINALYSIS DIP (DEVICE) - Abnormal; Notable for the following:    Hgb urine dipstick SMALL (*)  Leukocytes, UA SMALL (*)    All other components within normal limits  URINE CULTURE  CERVICOVAGINAL ANCILLARY ONLY    Imaging Review No results found.   MDM   1. Vaginal discharge    Will cover for BV and yeast with Flagyl and Diflucan, respectively. Vaginal cultures sent. We'll also send urine for culture, but I suspect the UA findings are due to contamination. Follow-up as needed.    Charm RingsErin J Arraya Buck, MD 02/28/16 [phone]

## 2016-02-28 NOTE — Discharge Instructions (Signed)
It looks like you have a mix of BV and yeast. Take Flagyl twice a day for 7 days. Do not drink alcohol while taking this medicine. Take Diflucan one pill today and the second one when you finish the Flagyl. We will call you with the results in 2-3 days. Follow-up as needed.

## 2016-02-29 LAB — URINE CULTURE

## 2016-02-29 LAB — CERVICOVAGINAL ANCILLARY ONLY
Chlamydia: NEGATIVE
Neisseria Gonorrhea: POSITIVE — AB

## 2016-03-01 ENCOUNTER — Encounter (HOSPITAL_COMMUNITY): Payer: Self-pay | Admitting: Emergency Medicine

## 2016-03-01 ENCOUNTER — Ambulatory Visit (HOSPITAL_COMMUNITY)
Admission: EM | Admit: 2016-03-01 | Discharge: 2016-03-01 | Disposition: A | Discharge status: Home / Self Care | Payer: 59 | Attending: Emergency Medicine | Admitting: Emergency Medicine

## 2016-03-01 DIAGNOSIS — A5409 Other gonococcal infection of lower genitourinary tract: Secondary | ICD-10-CM | POA: Diagnosis not present

## 2016-03-01 MED ORDER — AZITHROMYCIN 250 MG PO TABS
ORAL_TABLET | ORAL | Status: AC
  Filled 2016-03-01: qty 4

## 2016-03-01 MED ORDER — CEFTRIAXONE SODIUM 250 MG IJ SOLR
INTRAMUSCULAR | Status: AC
  Filled 2016-03-01: qty 250

## 2016-03-01 MED ORDER — AZITHROMYCIN 250 MG PO TABS
1000.0000 mg | ORAL_TABLET | Freq: Once | ORAL | Status: AC
  Administered 2016-03-01: 1000 mg via ORAL

## 2016-03-01 MED ORDER — LIDOCAINE HCL (PF) 1 % IJ SOLN
INTRAMUSCULAR | Status: AC
  Filled 2016-03-01: qty 2

## 2016-03-01 MED ORDER — CEFTRIAXONE SODIUM 250 MG IJ SOLR
250.0000 mg | Freq: Once | INTRAMUSCULAR | Status: AC
  Administered 2016-03-01: 250 mg via INTRAMUSCULAR

## 2016-03-01 NOTE — ED Notes (Signed)
Pt is here to get treated for Gon/Chlam Spoke w/Dr. Piedad ClimesHonig and pt is to get Rocephin 250 mg and Azithromycin 1,000 mg Pt reports partner has been treated who is here w/pt  She verb understanding  Pt. Dr. Harrel LemonHonig's note, Notes Recorded by Charm RingsErin J Honig, MD on 02/29/2016 at 3:23 PM Please notify patient and health department that her testing came back positive for gonorrhea. She will need to return for treatment with rocephin 250mg  IM and azithromycin 1,000mg  PO. She will need to notify sexual partners of need for testing and treatment as well.

## 2016-03-03 LAB — CERVICOVAGINAL ANCILLARY ONLY: Wet Prep (BD Affirm): POSITIVE — AB

## 2016-03-07 ENCOUNTER — Telehealth (HOSPITAL_COMMUNITY): Payer: Self-pay | Admitting: Emergency Medicine

## 2016-03-07 NOTE — ED Notes (Addendum)
LM on pt's VM [phone] Need to give lab results from recent visit on 6/29  Also let pt know labs can be obtained from MyChart  Per Dr. Dayton ScrapeMurray,   Notes Recorded by Charm RingsErin J Honig, MD on 03/04/2016 at 10:58 AM Please notify patient of positive trichomonas. This is an STD. She was treated for this with the Flagyl prescription on 6/29. Her partner should also be treated.

## 2018-04-15 ENCOUNTER — Encounter (HOSPITAL_COMMUNITY): Payer: Self-pay | Admitting: Emergency Medicine

## 2018-04-15 ENCOUNTER — Ambulatory Visit (HOSPITAL_COMMUNITY)
Admission: EM | Admit: 2018-04-15 | Discharge: 2018-04-15 | Disposition: A | Discharge status: Home / Self Care | Payer: Managed Care, Other (non HMO) | Attending: Family Medicine | Admitting: Family Medicine

## 2018-04-15 DIAGNOSIS — Z113 Encounter for screening for infections with a predominantly sexual mode of transmission: Secondary | ICD-10-CM

## 2018-04-15 DIAGNOSIS — Z202 Contact with and (suspected) exposure to infections with a predominantly sexual mode of transmission: Secondary | ICD-10-CM | POA: Insufficient documentation

## 2018-04-15 DIAGNOSIS — Z79899 Other long term (current) drug therapy: Secondary | ICD-10-CM | POA: Diagnosis not present

## 2018-04-15 LAB — POCT URINALYSIS DIP (DEVICE)
Bilirubin Urine: NEGATIVE
Glucose, UA: NEGATIVE mg/dL
Ketones, ur: NEGATIVE mg/dL
Nitrite: NEGATIVE
Protein, ur: NEGATIVE mg/dL
Specific Gravity, Urine: 1.02 (ref 1.005–1.030)
Urobilinogen, UA: 0.2 mg/dL (ref 0.0–1.0)
pH: 7 (ref 5.0–8.0)

## 2018-04-15 MED ORDER — AZITHROMYCIN 250 MG PO TABS
ORAL_TABLET | ORAL | Status: AC
  Filled 2018-04-15: qty 4

## 2018-04-15 MED ORDER — AZITHROMYCIN 250 MG PO TABS
1000.0000 mg | ORAL_TABLET | Freq: Once | ORAL | Status: AC
Start: 2018-04-15 — End: 2018-04-15
  Administered 2018-04-15: 1000 mg via ORAL

## 2018-04-15 NOTE — Discharge Instructions (Addendum)
We are running the test for gonorrhea, chlamydia, and trichomonas.  In the meantime, just to be safe, we are giving you medicine here to treat for chlamydia.  We should have the results of the test tomorrow or the next day.  We will call you if it is positive.  You can look at my chart for results in the meantime.

## 2018-04-15 NOTE — ED Triage Notes (Signed)
Pt here for STD screenings

## 2018-04-15 NOTE — ED Provider Notes (Signed)
[facility]    CSN: [id number] Arrival date & time: 04/15/18  1818     History   Chief Complaint Chief Complaint  Patient presents with  . Exposure to STD    HPI Alexis Gonzales is a 52 y.o. female.   This 52 year old woman comes in for STD testing.  She says her husband is being treated for chlamydia at the present time.  She is having no symptoms such as fever, abdominal pain, or discharge.  Patient has had both trichomonas and Neisseria gonorrhea in the past.  Patient is no longer having menstrual periods.     Past Medical History:  Diagnosis Date  . Wears glasses     There are no active problems to display for this patient.   Past Surgical History:  Procedure Laterality Date  . BREAST BIOPSY Right 09/27/2013   Procedure: RIGHT BREAST MASS EXCISION;  Surgeon: Emelia LoronMatthew Wakefield, MD;  Location: [facility];  Service: General;  Laterality: Right;  . TUBAL LIGATION    . TUBAL LIGATION    . WISDOM TOOTH EXTRACTION      OB History   None      Home Medications    Prior to Admission medications   Medication Sig Start Date End Date Taking? Authorizing Provider  calcium carbonate (OS-CAL - DOSED IN MG OF ELEMENTAL CALCIUM) 1250 MG tablet Take 1 tablet by mouth.    [provider]  fluconazole (DIFLUCAN) 150 MG tablet Take 1 tablet (150 mg total) by mouth once. Take 2nd after completing Flagyl Patient not taking: Reported on 04/15/2018 02/28/16   Charm RingsHonig, Erin J, MD  metroNIDAZOLE (FLAGYL) 500 MG tablet Take 1 tablet (500 mg total) by mouth 2 (two) times daily. Patient not taking: Reported on 04/15/2018 02/28/16   Charm RingsHonig, Erin J, MD  Multiple Vitamins-Minerals (MULTIVITAMIN WITH MINERALS) tablet Take 1 tablet by mouth daily.    [provider]    Family History Family History  Problem Relation Age of Onset  . Cancer Mother        breast ca  . Heart disease Father   . Cancer Paternal Uncle        throat ca    Social  History Social History   Tobacco Use  . Smoking status: Never Smoker  Substance Use Topics  . Alcohol use: Yes    Comment: occ  . Drug use: No     Allergies   Patient has no known allergies.   Review of Systems Review of Systems   Physical Exam Triage Vital Signs ED Triage Vitals [04/15/18 1849]  Enc Vitals Group     BP (!) 152/95     Pulse Rate 96     Resp 18     Temp 98.5 F (36.9 C)     Temp Source Oral     SpO2 100 %     Weight      Height      Head Circumference      Peak Flow      Pain Score      Pain Loc      Pain Edu?      Excl. in GC?    No data found.  Updated Vital Signs BP (!) 152/95 (BP Location: Left Arm)   Pulse 96   Temp 98.5 F (36.9 C) (Oral)   Resp 18   SpO2 100%   Visual Acuity Right Eye Distance:   Left Eye Distance:   Bilateral Distance:  Right Eye Near:   Left Eye Near:    Bilateral Near:     Physical Exam  Constitutional: She is oriented to person, place, and time. She appears well-developed and well-nourished.  HENT:  Head: Normocephalic and atraumatic.  Eyes: Pupils are equal, round, and reactive to light. Conjunctivae and EOM are normal.  Neck: Normal range of motion. Neck supple.  Pulmonary/Chest: Effort normal.  Musculoskeletal: Normal range of motion.  Neurological: She is alert and oriented to person, place, and time.  Skin: Skin is warm and dry.  Nursing note and vitals reviewed.    UC Treatments / Results  Labs (all labs ordered are listed, but only abnormal results are displayed) Labs Reviewed  URINE CYTOLOGY ANCILLARY ONLY    EKG None  Radiology No results found.  Procedures Procedures (including critical care time)  Medications Ordered in UC Medications  azithromycin (ZITHROMAX) tablet 1,000 mg (has no administration in time range)    Initial Impression / Assessment and Plan / UC Course  I have reviewed the triage vital signs and the nursing notes.  Pertinent labs & imaging results  that were available during my care of the patient were reviewed by me and considered in my medical decision making (see chart for details).     Final Clinical Impressions(s) / UC Diagnoses   Final diagnoses:  STD exposure     Discharge Instructions     We are running the test for gonorrhea, chlamydia, and trichomonas.  In the meantime, just to be safe, we are giving you medicine here to treat for chlamydia.  We should have the results of the test tomorrow or the next day.  We will call you if it is positive.  You can look at my chart for results in the meantime.    ED Prescriptions    None     Controlled Substance Prescriptions [place] Controlled Substance Registry consulted? Not Applicable   Elvina SidleLauenstein, Keagen Heinlen, MD 04/15/18 1900

## 2018-04-16 LAB — CERVICOVAGINAL ANCILLARY ONLY
Chlamydia: POSITIVE — AB
Neisseria Gonorrhea: NEGATIVE
Trichomonas: NEGATIVE

## 2018-04-19 ENCOUNTER — Telehealth (HOSPITAL_COMMUNITY): Payer: Self-pay

## 2018-04-19 NOTE — Telephone Encounter (Signed)
Chlamydia is positive.  This was treated at the urgent care visit with po zithromax 1g.  Need to educate pt to please refrain from sexual intercourse for 7 days to give the medicine time to work.  Sexual partners need to be notified and tested/treated.  Condoms may reduce risk of reinfection.  Recheck or followup with PCP for further evaluation if symptoms are not improving.  [facility] notified.  No answer at this time. Voicemail left.

## 2018-04-22 ENCOUNTER — Telehealth (HOSPITAL_COMMUNITY): Payer: Self-pay

## 2018-04-22 NOTE — Telephone Encounter (Signed)
No answer x2 

## 2018-04-26 ENCOUNTER — Telehealth (HOSPITAL_COMMUNITY): Payer: Self-pay

## 2018-04-26 NOTE — Telephone Encounter (Signed)
Attempted to reach patient x 3. Letter sent. 

## 2018-07-25 ENCOUNTER — Encounter (HOSPITAL_COMMUNITY): Payer: Self-pay | Admitting: Emergency Medicine

## 2018-07-25 ENCOUNTER — Ambulatory Visit (HOSPITAL_COMMUNITY)
Admission: EM | Admit: 2018-07-25 | Discharge: 2018-07-25 | Disposition: A | Discharge status: Home / Self Care | Payer: Managed Care, Other (non HMO) | Attending: Family Medicine | Admitting: Family Medicine

## 2018-07-25 DIAGNOSIS — Z79899 Other long term (current) drug therapy: Secondary | ICD-10-CM | POA: Insufficient documentation

## 2018-07-25 DIAGNOSIS — N3001 Acute cystitis with hematuria: Secondary | ICD-10-CM | POA: Diagnosis not present

## 2018-07-25 DIAGNOSIS — B9689 Other specified bacterial agents as the cause of diseases classified elsewhere: Secondary | ICD-10-CM | POA: Insufficient documentation

## 2018-07-25 DIAGNOSIS — R350 Frequency of micturition: Secondary | ICD-10-CM | POA: Diagnosis present

## 2018-07-25 LAB — POCT URINALYSIS DIP (DEVICE)
Bilirubin Urine: NEGATIVE
Glucose, UA: NEGATIVE mg/dL
Ketones, ur: NEGATIVE mg/dL
Nitrite: NEGATIVE
Protein, ur: NEGATIVE mg/dL
Specific Gravity, Urine: >=1.03 (ref 1.005–1.030)
Urobilinogen, UA: 0.2 mg/dL (ref 0.0–1.0)
pH: 7 (ref 5.0–8.0)

## 2018-07-25 MED ORDER — NITROFURANTOIN MONOHYD MACRO 100 MG PO CAPS: 100.0000 mg | ORAL_CAPSULE | Freq: Two times a day (BID) | ORAL | 0 refills | Status: DC

## 2018-07-25 MED ORDER — PHENAZOPYRIDINE HCL 200 MG PO TABS: 200.0000 mg | ORAL_TABLET | Freq: Three times a day (TID) | ORAL | 0 refills | Status: DC

## 2018-07-25 NOTE — Discharge Instructions (Addendum)
Urine did show white blood cells and blood which could be a sign of infection.   Urine culture sent.   Push fluids and get plenty of rest.   Take antibiotic as directed and to completion Take pyridium as prescribed and as needed for symptomatic relief Please follow up with PCP in 2-4 weeks to have urine rechecked  Return here or go to ER if you have any new or worsening symptoms such as fever, worsening abdominal pain, nausea/vomiting, flank pain, etc...Marland Kitchen

## 2018-07-25 NOTE — ED Provider Notes (Signed)
[facility]   CC: UTI symptoms  SUBJECTIVE:  Alexis Gonzales is a 52 y.o. female who complains of urinary frequency, urgency and bladder pressure that began 2 days ago.  Patient denies a precipitating event, recent sexual encounter, excessive caffeine intake.  Does admit to caffeine use, but not excessive.  Has tried drinking cranberry juice without relief.  Symptoms are made worse with urination.  Admits to similar symptoms in the past and diagnosed with UTI.  Denies fever, chills, nausea, vomiting, abdominal pain, flank pain, abnormal vaginal discharge or bleeding, hematuria.  Last BM yesterday and normal for patient.    LMP: Patient's last menstrual period was 02/28/2015.  ROS: As in HPI.  Past Medical History:  Diagnosis Date  . Wears glasses    Past Surgical History:  Procedure Laterality Date  . BREAST BIOPSY Right 09/27/2013   Procedure: RIGHT BREAST MASS EXCISION;  Surgeon: Emelia LoronMatthew Wakefield, MD;  Location: [facility];  Service: General;  Laterality: Right;  . TUBAL LIGATION    . TUBAL LIGATION    . WISDOM TOOTH EXTRACTION     No Known Allergies No current facility-administered medications on file prior to encounter.    Current Outpatient Medications on File Prior to Encounter  Medication Sig Dispense Refill  . amLODipine (NORVASC) 5 MG tablet Take 5 mg by mouth daily.    . calcium carbonate (OS-CAL - DOSED IN MG OF ELEMENTAL CALCIUM) 1250 MG tablet Take 1 tablet by mouth.    . Multiple Vitamins-Minerals (MULTIVITAMIN WITH MINERALS) tablet Take 1 tablet by mouth daily.     Social History   Socioeconomic History  . Marital status: Married    Spouse name: Not on file  . Number of children: Not on file  . Years of education: Not on file  . Highest education level: Not on file  Occupational History  . Not on file  Social Needs  . Financial resource strain: Not on file  . Food insecurity:    Worry: Not on file    Inability: Not on file    . Transportation needs:    Medical: Not on file    Non-medical: Not on file  Tobacco Use  . Smoking status: Never Smoker  . Smokeless tobacco: Never Used  Substance and Sexual Activity  . Alcohol use: Yes    Comment: occ  . Drug use: No  . Sexual activity: Not on file  Lifestyle  . Physical activity:    Days per week: Not on file    Minutes per session: Not on file  . Stress: Not on file  Relationships  . Social connections:    Talks on phone: Not on file    Gets together: Not on file    Attends religious service: Not on file    Active member of club or organization: Not on file    Attends meetings of clubs or organizations: Not on file    Relationship status: Not on file  . Intimate partner violence:    Fear of current or ex partner: Not on file    Emotionally abused: Not on file    Physically abused: Not on file    Forced sexual activity: Not on file  Other Topics Concern  . Not on file  Social History Narrative  . Not on file   Family History  Problem Relation Age of Onset  . Cancer Mother        breast ca  . Heart disease Father   .  Cancer Paternal Uncle        throat ca    OBJECTIVE:  Vitals:   07/25/18 1645  BP: 134/86  Pulse: 80  Temp: 97.7 F (36.5 C)  TempSrc: Oral  SpO2: 100%   General appearance: Alert in no acute distress HEENT: NCAT.  Oropharynx clear.  Lungs: clear to auscultation bilaterally without adventitious breath sounds Heart: regular rate and rhythm.  Radial pulses 2+ symmetrical bilaterally Abdomen: soft; non-distended; no tenderness; bowel sounds present; no guarding Back: no CVA tenderness Extremities: no edema; symmetrical with no gross deformities Skin: warm and dry Neurologic: Ambulates from chair to exam table without difficulty Psychological: alert and cooperative; normal mood and affect  Labs Reviewed  POCT URINALYSIS DIP (DEVICE) - Abnormal; Notable for the following components:      Result Value   Hgb urine  dipstick TRACE (*)    Leukocytes, UA SMALL (*)    All other components within normal limits  URINE CULTURE    ASSESSMENT & PLAN:  1. Acute cystitis with hematuria     Meds ordered this encounter  Medications  . nitrofurantoin, macrocrystal-monohydrate, (MACROBID) 100 MG capsule    Sig: Take 1 capsule (100 mg total) by mouth 2 (two) times daily.    Dispense:  10 capsule    Refill:  0    Order Specific Question:   Supervising Provider    Answer:   Isa Rankin [phone]  . phenazopyridine (PYRIDIUM) 200 MG tablet    Sig: Take 1 tablet (200 mg total) by mouth 3 (three) times daily.    Dispense:  6 tablet    Refill:  0    Order Specific Question:   Supervising Provider    Answer:   Isa Rankin [[id number]]   Urine did show white blood cells and blood which could be a sign of infection.   Urine culture sent.   Push fluids and get plenty of rest.   Take antibiotic as directed and to completion Take pyridium as prescribed and as needed for symptomatic relief Please follow up with PCP in 2-4 weeks to have urine rechecked  Return here or go to ER if you have any new or worsening symptoms such as fever, worsening abdominal pain, nausea/vomiting, flank pain, etc...  Outlined signs and symptoms indicating need for more acute intervention. Patient verbalized understanding. After Visit Summary given.     Rennis Harding, PA-C 07/25/18 [phone]

## 2018-07-25 NOTE — ED Triage Notes (Signed)
Pt reports pressure on her bladder for the last few days.  She denies any other symptoms at this time.

## 2018-07-27 LAB — URINE CULTURE: Culture: 10000 — AB

## 2018-08-13 ENCOUNTER — Other Ambulatory Visit: Payer: Self-pay

## 2019-11-10 ENCOUNTER — Encounter (HOSPITAL_COMMUNITY): Payer: Self-pay

## 2019-11-10 ENCOUNTER — Other Ambulatory Visit: Payer: Self-pay

## 2019-11-10 ENCOUNTER — Ambulatory Visit (HOSPITAL_COMMUNITY)
Admission: EM | Admit: 2019-11-10 | Discharge: 2019-11-10 | Disposition: A | Discharge status: Home / Self Care | Payer: Managed Care, Other (non HMO) | Attending: Family Medicine | Admitting: Family Medicine

## 2019-11-10 DIAGNOSIS — N390 Urinary tract infection, site not specified: Secondary | ICD-10-CM | POA: Insufficient documentation

## 2019-11-10 LAB — POCT URINALYSIS DIP (DEVICE)
Bilirubin Urine: NEGATIVE
Glucose, UA: NEGATIVE mg/dL
Ketones, ur: NEGATIVE mg/dL
Nitrite: NEGATIVE
Protein, ur: NEGATIVE mg/dL
Specific Gravity, Urine: 1.025 (ref 1.005–1.030)
Urobilinogen, UA: 0.2 mg/dL (ref 0.0–1.0)
pH: 7.5 (ref 5.0–8.0)

## 2019-11-10 MED ORDER — PHENAZOPYRIDINE HCL 200 MG PO TABS: 200.0000 mg | ORAL_TABLET | Freq: Three times a day (TID) | ORAL | 0 refills | Status: DC

## 2019-11-10 MED ORDER — NITROFURANTOIN MONOHYD MACRO 100 MG PO CAPS: 100.0000 mg | ORAL_CAPSULE | Freq: Two times a day (BID) | ORAL | 0 refills | Status: DC

## 2019-11-10 NOTE — ED Triage Notes (Signed)
Pt presents with blood in urine and urinary pressure since yesterday.

## 2019-11-10 NOTE — Discharge Instructions (Signed)
Take the antibiotic 2 times a day.  Take 2 doses today Take the Pyridium for urinary discomfort.  Take for 1 to 2 days, as needed This will make your urine orange. Call or return if you worsen any time instead of better, develop fever, flank pain, nausea or vomiting

## 2019-11-10 NOTE — ED Provider Notes (Signed)
[place]    CSN: [id number] Arrival date & time: 11/10/19  0865      History   Chief Complaint Chief Complaint  Patient presents with  . Urinary Tract Infection    HPI Alexis Gonzales is a 54 y.o. female.   HPI   Patient is here for blood in her urine and urinary pressure since yesterday.  She identifies this is a likely bladder infection.  She has had them before.  She does not have any abdominal pain nausea or vomiting.  No flank pain.  No fever or chills.  No history of kidney stones or kidney infections.  Past Medical History:  Diagnosis Date  . Wears glasses     There are no problems to display for this patient.   Past Surgical History:  Procedure Laterality Date  . BREAST BIOPSY Right 09/27/2013   Procedure: RIGHT BREAST MASS EXCISION;  Surgeon: Rolm Bookbinder, MD;  Location: [facility];  Service: General;  Laterality: Right;  . TUBAL LIGATION    . TUBAL LIGATION    . WISDOM TOOTH EXTRACTION      OB History   No obstetric history on file.      Home Medications    Prior to Admission medications   Medication Sig Start Date End Date Taking? Authorizing Provider  amLODipine (NORVASC) 5 MG tablet Take 5 mg by mouth daily. 05/05/18   [provider]  calcium carbonate (OS-CAL - DOSED IN MG OF ELEMENTAL CALCIUM) 1250 MG tablet Take 1 tablet by mouth.    [provider]  Multiple Vitamins-Minerals (MULTIVITAMIN WITH MINERALS) tablet Take 1 tablet by mouth daily.    [provider]  nitrofurantoin, macrocrystal-monohydrate, (MACROBID) 100 MG capsule Take 1 capsule (100 mg total) by mouth 2 (two) times daily. 11/10/19   Raylene Everts, MD  phenazopyridine (PYRIDIUM) 200 MG tablet Take 1 tablet (200 mg total) by mouth 3 (three) times daily. 11/10/19   Raylene Everts, MD    Family History Family History  Problem Relation Age of Onset  . Cancer Mother        breast ca  . Heart disease Father   .  Cancer Paternal Uncle        throat ca    Social History Social History   Tobacco Use  . Smoking status: Never Smoker  . Smokeless tobacco: Never Used  Substance Use Topics  . Alcohol use: Yes    Comment: occ  . Drug use: No     Allergies   Patient has no known allergies.   Review of Systems Review of Systems  Genitourinary: Positive for dysuria and frequency.     Physical Exam Triage Vital Signs ED Triage Vitals  Enc Vitals Group     BP 11/10/19 1043 (!) 137/96     Pulse Rate 11/10/19 1043 70     Resp 11/10/19 1043 16     Temp 11/10/19 1043 98.1 F (36.7 C)     Temp Source 11/10/19 1043 Oral     SpO2 11/10/19 1043 100 %                    0               No data found.  Updated Vital Signs BP (!) 137/96 (BP Location: Left Arm)   Pulse 70   Temp 98.1 F (36.7 C) (Oral)   Resp 16   LMP 02/28/2015   SpO2  100%       Physical Exam Constitutional:      General: She is not in acute distress.    Appearance: She is well-developed.     Comments: Exam by observation  HENT:     Head: Normocephalic and atraumatic.     Mouth/Throat:     Comments: Mask in place Eyes:     Conjunctiva/sclera: Conjunctivae normal.     Pupils: Pupils are equal, round, and reactive to light.  Cardiovascular:     Rate and Rhythm: Normal rate.  Pulmonary:     Effort: Pulmonary effort is normal. No respiratory distress.  Musculoskeletal:        General: Normal range of motion.     Cervical back: Normal range of motion.  Skin:    General: Skin is warm and dry.  Neurological:     Mental Status: She is alert.  Psychiatric:        Mood and Affect: Mood normal.        Behavior: Behavior normal.      UC Treatments / Results  Labs (all labs ordered are listed, but only abnormal results are displayed) Labs Reviewed  POCT URINALYSIS DIP (DEVICE) - Abnormal; Notable for the following components:      Result Value   Hgb urine dipstick TRACE (*)    Leukocytes,Ua MODERATE  (*)    All other components within normal limits  URINE CULTURE    EKG   Radiology No results found.  Procedures Procedures (including critical care time)  Medications Ordered in UC Medications - No data to display  Initial Impression / Assessment and Plan / UC Course  I have reviewed the triage vital signs and the nursing notes.  Pertinent labs & imaging results that were available during my care of the patient were reviewed by me and considered in my medical decision making (see chart for details).     I reviewed patient's prior notes for cystitis.  Her urine cultures usually do not grow significant amounts.  She is having typical symptoms of cystitis, and with her report of hematuria and pressure at the gets reasonable to cover her with antibiotics.  I advised her check my chart in a couple of days to see what her culture report was showing.  She should follow-up with her primary care doctor. Final Clinical Impressions(s) / UC Diagnoses   Final diagnoses:  Lower urinary tract infection     Discharge Instructions     Take the antibiotic 2 times a day.  Take 2 doses today Take the Pyridium for urinary discomfort.  Take for 1 to 2 days, as needed This will make your urine orange. Call or return if you worsen any time instead of better, develop fever, flank pain, nausea or vomiting    ED Prescriptions    Medication Sig Dispense Auth. Provider   nitrofurantoin, macrocrystal-monohydrate, (MACROBID) 100 MG capsule Take 1 capsule (100 mg total) by mouth 2 (two) times daily. 10 capsule Eustace Moore, MD   phenazopyridine (PYRIDIUM) 200 MG tablet Take 1 tablet (200 mg total) by mouth 3 (three) times daily. 6 tablet Eustace Moore, MD     PDMP not reviewed this encounter.   Eustace Moore, MD 11/10/19 [phone]

## 2019-11-12 LAB — URINE CULTURE
Culture: >=100000 — AB
Special Requests: NORMAL

## 2020-09-03 ENCOUNTER — Other Ambulatory Visit: Payer: Self-pay

## 2020-09-03 ENCOUNTER — Ambulatory Visit (HOSPITAL_COMMUNITY)
Admission: EM | Admit: 2020-09-03 | Discharge: 2020-09-03 | Disposition: A | Discharge status: Home / Self Care | Payer: Managed Care, Other (non HMO) | Attending: Family Medicine | Admitting: Family Medicine

## 2020-09-03 ENCOUNTER — Encounter (HOSPITAL_COMMUNITY): Payer: Self-pay | Admitting: Emergency Medicine

## 2020-09-03 DIAGNOSIS — N3090 Cystitis, unspecified without hematuria: Secondary | ICD-10-CM

## 2020-09-03 LAB — POCT URINALYSIS DIPSTICK, ED / UC
Bilirubin Urine: NEGATIVE
Glucose, UA: NEGATIVE mg/dL
Ketones, ur: NEGATIVE mg/dL
Nitrite: NEGATIVE
Protein, ur: NEGATIVE mg/dL
Specific Gravity, Urine: <=1.005 (ref 1.005–1.030)
Urobilinogen, UA: 0.2 mg/dL (ref 0.0–1.0)
pH: 5.5 (ref 5.0–8.0)

## 2020-09-03 MED ORDER — NITROFURANTOIN MONOHYD MACRO 100 MG PO CAPS: 100.0000 mg | ORAL_CAPSULE | Freq: Two times a day (BID) | ORAL | 0 refills | Status: DC

## 2020-09-03 NOTE — ED Provider Notes (Signed)
[facility]    CSN: [id number] Arrival date & time: 09/03/20  1737      History   Chief Complaint Chief Complaint  Patient presents with  . Urinary Frequency  . Hematuria  . Dysuria    HPI Alexis Gonzales is a 55 y.o. female.   HPI Patient has had some urinary frequency and dysuria for 2 days.  Today noticed some blood in her urine.  No fever or chills.  No abdominal pain.  No flank pain.  No history of kidney stones or kidney infections.  No vaginal discharge. Also has history of well-controlled hypertension Past Medical History:  Diagnosis Date  . Wears glasses     There are no problems to display for this patient.   Past Surgical History:  Procedure Laterality Date  . BREAST BIOPSY Right 09/27/2013   Procedure: RIGHT BREAST MASS EXCISION;  Surgeon: Emelia Loron, MD;  Location: [facility];  Service: General;  Laterality: Right;  . TUBAL LIGATION    . TUBAL LIGATION    . WISDOM TOOTH EXTRACTION      OB History   No obstetric history on file.      Home Medications    Prior to Admission medications   Medication Sig Start Date End Date Taking? Authorizing Provider  amLODipine (NORVASC) 5 MG tablet Take 5 mg by mouth daily. 05/05/18   [provider]  calcium carbonate (OS-CAL - DOSED IN MG OF ELEMENTAL CALCIUM) 1250 MG tablet Take 1 tablet by mouth.    [provider]  Multiple Vitamins-Minerals (MULTIVITAMIN WITH MINERALS) tablet Take 1 tablet by mouth daily.    [provider]  nitrofurantoin, macrocrystal-monohydrate, (MACROBID) 100 MG capsule Take 1 capsule (100 mg total) by mouth 2 (two) times daily. 09/03/20   Eustace Moore, MD  phenazopyridine (PYRIDIUM) 200 MG tablet Take 1 tablet (200 mg total) by mouth 3 (three) times daily. 11/10/19   Eustace Moore, MD    Family History Family History  Problem Relation Age of Onset  . Cancer Mother        breast ca  . Heart disease Father   .  Cancer Paternal Uncle        throat ca    Social History Social History   Tobacco Use  . Smoking status: Never Smoker  . Smokeless tobacco: Never Used  Substance Use Topics  . Alcohol use: Yes    Comment: occ  . Drug use: No     Allergies   Patient has no known allergies.   Review of Systems Review of Systems See HPI  Physical Exam Triage Vital Signs ED Triage Vitals  Enc Vitals Group     BP 09/03/20 1943 (!) 140/91     Pulse Rate 09/03/20 1943 87     Resp 09/03/20 1943 17     Temp 09/03/20 1943 98.2 F (36.8 C)     Temp Source 09/03/20 1943 Oral     SpO2 09/03/20 1943 100 %     Weight --      Height --      Head Circumference --      Peak Flow --      Pain Score 09/03/20 1942 0     Pain Loc --      Pain Edu? --      Excl. in GC? --    No data found.  Updated Vital Signs BP (!) 140/91 (BP Location: Right Arm)   Pulse  87   Temp 98.2 F (36.8 C) (Oral)   Resp 17   LMP 02/28/2015   SpO2 100%      Physical Exam Constitutional:      General: She is not in acute distress.    Appearance: She is well-developed and well-nourished.  HENT:     Head: Normocephalic and atraumatic.     Mouth/Throat:     Mouth: Oropharynx is clear and moist.  Eyes:     Conjunctiva/sclera: Conjunctivae normal.     Pupils: Pupils are equal, round, and reactive to light.  Cardiovascular:     Rate and Rhythm: Normal rate.  Pulmonary:     Effort: Pulmonary effort is normal. No respiratory distress.  Abdominal:     General: There is no distension.     Palpations: Abdomen is soft.     Tenderness: There is no right CVA tenderness or left CVA tenderness.  Musculoskeletal:        General: No edema. Normal range of motion.     Cervical back: Normal range of motion.  Skin:    General: Skin is warm and dry.  Neurological:     Mental Status: She is alert.  Psychiatric:        Behavior: Behavior normal.      UC Treatments / Results  Labs (all labs ordered are listed, but  only abnormal results are displayed) Labs Reviewed  POCT URINALYSIS DIPSTICK, ED / UC - Abnormal; Notable for the following components:      Result Value   Hgb urine dipstick MODERATE (*)    Leukocytes,Ua MODERATE (*)    All other components within normal limits  URINE CULTURE    EKG   Radiology No results found.  Procedures Procedures (including critical care time)  Medications Ordered in UC Medications - No data to display  Initial Impression / Assessment and Plan / UC Course  I have reviewed the triage vital signs and the nursing notes.  Pertinent labs & imaging results that were available during my care of the patient were reviewed by me and considered in my medical decision making (see chart for details).     Final Clinical Impressions(s) / UC Diagnoses   Final diagnoses:  Cystitis     Discharge Instructions     Take antibiotic 2 times a day Drink plenty of fluids Your urine culture report will be available on MyChart Call or return for problems   ED Prescriptions    Medication Sig Dispense Auth. Provider   nitrofurantoin, macrocrystal-monohydrate, (MACROBID) 100 MG capsule Take 1 capsule (100 mg total) by mouth 2 (two) times daily. 10 capsule Eustace Moore, MD     PDMP not reviewed this encounter.   Eustace Moore, MD 09/03/20 2039

## 2020-09-03 NOTE — ED Triage Notes (Signed)
Presents with dysuria, frequency, and blood in urine xs 2 days.

## 2020-09-03 NOTE — Discharge Instructions (Addendum)
Take antibiotic 2 times a day Drink plenty of fluids Your urine culture report will be available on MyChart Call or return for problems

## 2020-09-05 LAB — URINE CULTURE

## 2020-09-06 ENCOUNTER — Telehealth (HOSPITAL_COMMUNITY): Payer: Self-pay

## 2020-09-06 LAB — URINE CULTURE: Culture: >=100000 — AB

## 2020-09-06 MED ORDER — CEPHALEXIN 500 MG PO CAPS: 500.0000 mg | ORAL_CAPSULE | Freq: Two times a day (BID) | ORAL | 0 refills | Status: AC

## 2022-08-11 ENCOUNTER — Telehealth: Payer: Self-pay | Admitting: Hematology and Oncology

## 2022-08-11 NOTE — Telephone Encounter (Signed)
Scheduled appointment per general. Patient is aware of appointment date and time. Patient is aware to arrive 15 mins prior to appointment time and to bring updated insurance cards. Patient is aware of location.

## 2022-09-03 ENCOUNTER — Inpatient Hospital Stay (HOSPITAL_BASED_OUTPATIENT_CLINIC_OR_DEPARTMENT_OTHER): Payer: Managed Care, Other (non HMO) | Admitting: Hematology and Oncology

## 2022-09-03 ENCOUNTER — Inpatient Hospital Stay: Payer: Managed Care, Other (non HMO) | Attending: Hematology and Oncology

## 2022-09-03 ENCOUNTER — Other Ambulatory Visit: Payer: Self-pay

## 2022-09-03 VITALS — BP 146/90 | HR 82 | Temp 97.5°F | Resp 15 | Wt 180.2 lb

## 2022-09-03 DIAGNOSIS — I10 Essential (primary) hypertension: Secondary | ICD-10-CM | POA: Diagnosis not present

## 2022-09-03 DIAGNOSIS — E785 Hyperlipidemia, unspecified: Secondary | ICD-10-CM | POA: Insufficient documentation

## 2022-09-03 DIAGNOSIS — D709 Neutropenia, unspecified: Secondary | ICD-10-CM

## 2022-09-03 DIAGNOSIS — D708 Other neutropenia: Secondary | ICD-10-CM

## 2022-09-03 DIAGNOSIS — Z79899 Other long term (current) drug therapy: Secondary | ICD-10-CM | POA: Diagnosis not present

## 2022-09-03 LAB — CBC WITH DIFFERENTIAL (CANCER CENTER ONLY)
Abs Immature Granulocytes: 0.01 10*3/uL (ref 0.00–0.07)
Basophils Absolute: 0 10*3/uL (ref 0.0–0.1)
Basophils Relative: 1 %
Eosinophils Absolute: 0 10*3/uL (ref 0.0–0.5)
Eosinophils Relative: 1 %
HCT: 33.6 % — ABNORMAL LOW (ref 36.0–46.0)
Hemoglobin: 11.8 g/dL — ABNORMAL LOW (ref 12.0–15.0)
Immature Granulocytes: 0 %
Lymphocytes Relative: 25 %
Lymphs Abs: 1.3 10*3/uL (ref 0.7–4.0)
MCH: 33.4 pg (ref 26.0–34.0)
MCHC: 35.1 g/dL (ref 30.0–36.0)
MCV: 95.2 fL (ref 80.0–100.0)
Monocytes Absolute: 0.5 10*3/uL (ref 0.1–1.0)
Monocytes Relative: 10 %
Neutro Abs: 3.2 10*3/uL (ref 1.7–7.7)
Neutrophils Relative %: 63 %
Platelet Count: 196 10*3/uL (ref 150–400)
RBC: 3.53 MIL/uL — ABNORMAL LOW (ref 3.87–5.11)
RDW: 13.6 % (ref 11.5–15.5)
WBC Count: 5 10*3/uL (ref 4.0–10.5)
nRBC: 0 % (ref 0.0–0.2)

## 2022-09-03 LAB — HEPATITIS B SURFACE ANTIBODY,QUALITATIVE: Hep B S Ab: NONREACTIVE

## 2022-09-03 LAB — FOLATE: Folate: 23.3 ng/mL (ref >5.9)

## 2022-09-03 LAB — CMP (CANCER CENTER ONLY)
ALT: 18 U/L (ref 0–44)
AST: 23 U/L (ref 15–41)
Albumin: 4.3 g/dL (ref 3.5–5.0)
Alkaline Phosphatase: 63 U/L (ref 38–126)
Anion gap: 6 (ref 5–15)
BUN: 12 mg/dL (ref 6–20)
CO2: 28 mmol/L (ref 22–32)
Calcium: 9.5 mg/dL (ref 8.9–10.3)
Chloride: 106 mmol/L (ref 98–111)
Creatinine: 0.67 mg/dL (ref 0.44–1.00)
GFR, Estimated: >60 mL/min (ref >60)
Glucose, Bld: 74 mg/dL (ref 70–99)
Potassium: 4.2 mmol/L (ref 3.5–5.1)
Sodium: 140 mmol/L (ref 135–145)
Total Bilirubin: 0.4 mg/dL (ref 0.3–1.2)
Total Protein: 6.9 g/dL (ref 6.5–8.1)

## 2022-09-03 LAB — HEPATITIS B SURFACE ANTIGEN: Hepatitis B Surface Ag: NONREACTIVE

## 2022-09-03 LAB — LACTATE DEHYDROGENASE: LDH: 154 U/L (ref 98–192)

## 2022-09-03 LAB — HIV ANTIBODY (ROUTINE TESTING W REFLEX): HIV Screen 4th Generation wRfx: NONREACTIVE

## 2022-09-03 LAB — C-REACTIVE PROTEIN: CRP: 0.5 mg/dL (ref <1.0)

## 2022-09-03 LAB — VITAMIN B12: Vitamin B-12: 818 pg/mL (ref 180–914)

## 2022-09-03 LAB — TSH: TSH: 1.074 u[IU]/mL (ref 0.350–4.500)

## 2022-09-03 LAB — SEDIMENTATION RATE: Sed Rate: 12 mm/hr (ref 0–22)

## 2022-09-03 LAB — HEPATITIS B CORE ANTIBODY, TOTAL: Hep B Core Total Ab: NONREACTIVE

## 2022-09-03 NOTE — Progress Notes (Signed)
[place] Telephone:([phone]   Fax:(336) [place] NOTE  Patient Care Team: Antony Contras, MD as PCP - General (Family Medicine)  Hematological/Oncological History # Mild Neutropenia 11/02/2021: WBC 3.3, Hgb 11.9, MCV 95, Plt 200, ANC 1.4 05/08/2022: WBC 3.3, Hgb 12.5, MCV 96, Plt 222. ANC 1.3 09/03/2022: establish care with Dr. Lorenso Courier   CHIEF COMPLAINTS/PURPOSE OF CONSULTATION:  "Neutropenia "  HISTORY OF PRESENTING ILLNESS:  Alexis Gonzales 57 y.o. female with medical history significant for HTN and HLD who presents for evaluation of neutropenia.   On review of the previous records Alexis Gonzales had labs drawn on 11/02/2021 which showed WBC 3.3, Hgb 11.9, MCV 95, Plt 200, ANC 1.4.  Repeat labs were drawn 6 months later on 05/08/2022 which showed WBC 3.3, Hgb 12.5, MCV 96, Plt 222. ANC 1.3.  Due to concern for this mild neutropenia she was referred to hematology for further evaluation and management.  On exam today Alexis Gonzales reports that this is the first she has heard of having a low white blood cell count.  She reports that she used to have frequent bladder infections in the last had 1 about 1 year ago.  She does take antibiotics for this.  She does not have other infections such as sinus infections, pneumonias, or rashes.  She reports that she eats a regular diet but not a whole lot of red meat.  She eats it only about once per month and prefers chicken and fish.  On further discussion she reports her family history is remarkable for hypertension and heart attack in her father as well as breast cancer in her mother.  She has a brother and sister both with hypertension.  She has 2 healthy children.  She is a never smoker and only rarely drinks.  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  She does have some occasional issues with lower back pain and some fatigue on occasion but "nothing out of the ordinary.  She has no history of thyroid disease.  She  otherwise denies any fevers, chills, sweats, nausea, vomiting or diarrhea.  A full 10 point ROS was otherwise negative.  MEDICAL HISTORY:  Past Medical History:  Diagnosis Date   Wears glasses     SURGICAL HISTORY: Past Surgical History:  Procedure Laterality Date   BREAST BIOPSY Right 09/27/2013   Procedure: RIGHT BREAST MASS EXCISION;  Surgeon: Rolm Bookbinder, MD;  Location: [place];  Service: General;  Laterality: Right;   TUBAL LIGATION     TUBAL LIGATION     WISDOM TOOTH EXTRACTION      SOCIAL HISTORY: Social History   Socioeconomic History   Marital status: Married    Spouse name: Not on file   Number of children: Not on file   Years of education: Not on file   Highest education level: Not on file  Occupational History   Not on file  Tobacco Use   Smoking status: Never   Smokeless tobacco: Never  Substance and Sexual Activity   Alcohol use: Yes    Comment: occ   Drug use: No   Sexual activity: Not on file  Other Topics Concern   Not on file  Social History Narrative   Not on file   Social Determinants of Health   Financial Resource Strain: Not on file  Food Insecurity: Not on file  Transportation Needs: Not on file  Physical Activity: Not on file  Stress: Not on file  Social  Connections: Not on file  Intimate Partner Violence: Not on file    FAMILY HISTORY: Family History  Problem Relation Age of Onset   Cancer Mother        breast ca   Heart disease Father    Cancer Paternal Uncle        throat ca    ALLERGIES:  has No Known Allergies.  MEDICATIONS:  Current Outpatient Medications  Medication Sig Dispense Refill   losartan (COZAAR) 100 MG tablet Take 100 mg by mouth daily.     amLODipine (NORVASC) 5 MG tablet Take 5 mg by mouth daily.     Multiple Vitamins-Minerals (MULTIVITAMIN WITH MINERALS) tablet Take 1 tablet by mouth daily.     No current facility-administered medications for this visit.    REVIEW OF SYSTEMS:    Constitutional: ( - ) fevers, ( - )  chills , ( - ) night sweats Eyes: ( - ) blurriness of vision, ( - ) double vision, ( - ) watery eyes Ears, nose, mouth, throat, and face: ( - ) mucositis, ( - ) sore throat Respiratory: ( - ) cough, ( - ) dyspnea, ( - ) wheezes Cardiovascular: ( - ) palpitation, ( - ) chest discomfort, ( - ) lower extremity swelling Gastrointestinal:  ( - ) nausea, ( - ) heartburn, ( - ) change in bowel habits Skin: ( - ) abnormal skin rashes Lymphatics: ( - ) new lymphadenopathy, ( - ) easy bruising Neurological: ( - ) numbness, ( - ) tingling, ( - ) new weaknesses Behavioral/Psych: ( - ) mood change, ( - ) new changes  All other systems were reviewed with the patient and are negative.  PHYSICAL EXAMINATION:  Vitals:   09/03/22 0901  BP: (!) 146/90  Pulse: 82  Resp: 15  Temp: (!) 97.5 F (36.4 C)   Filed Weights   09/03/22 0901  Weight: 180 lb 3.2 oz (81.7 kg)    GENERAL: well appearing middle-aged African-American female in NAD  SKIN: skin color, texture, turgor are normal, no rashes or significant lesions EYES: conjunctiva are pink and non-injected, sclera clear LUNGS: clear to auscultation and percussion with normal breathing effort HEART: regular rate & rhythm and no murmurs and no lower extremity edema Musculoskeletal: no cyanosis of digits and no clubbing  PSYCH: alert & oriented x 3, fluent speech NEURO: no focal motor/sensory deficits  LABORATORY DATA:  I have reviewed the data as listed    Latest Ref Rng & Units 09/27/2013    1:25 PM  CBC  Hemoglobin 12.0 - 15.0 g/dL 12.2         No data to display           ASSESSMENT & PLAN Alexis Gonzales 57 y.o. female with medical history significant for HTN and HLD who presents for evaluation of neutropenia.   After review of the labs, review of the records, and discussion with the patient the patients findings are most consistent with a mild neutropenia.   The differential for  neutropenia includes nutritional deficiency, inflammatory disorder, medication side effect, infectious etiology, or congenital condition (such as benign ethnic neutropenia). The patient is not taking any medications known to cause neutropenia. Workup for this condition includes viral serologies (HIV, Hep B and Hep C),  vitamin b12/folate, and inflammatory markers with ESR and CRP.    A common cause for low WBC is benign ethnic neutropenia (BEN). This is a condition whereby individuals of African or Mediterranean descent have lower  WBC than the general population. The condition typically has an absolute neutrophil count (ANC) between 1000-1500 with no recurrent infections. Patients with this condition have normal functioning immune systems and have no consequences as a result of their low ANC. BEN is a diagnosis of exclusion, so it would require the full above workup to make.    # Neutropenia, likely Benign Ethnic Neutropenia  --repeat CBC and CMP  --infectious serology testing with Hep B and HIV. Hep C negative on outside labs --nutritional evaluation with Vitamin b12, folate  --inflammatory workup with ESR and CRP. Will also order TSH and LDH.  --RTC pending the results of the above studies. .   Orders Placed This Encounter  Procedures   CBC with Differential ([facility] Only)    Standing Status:   Future    Standing Expiration Date:   09/04/2023   CMP ([facility]cer [facility] only)    Standing Status:   Future    Standing Expiration Date:   09/04/2023   Lactate dehydrogenase (LDH)    Standing Status:   Future    Standing Expiration Date:   09/03/2023   Sedimentation rate    Standing Status:   Future    Standing Expiration Date:   09/03/2023   C-reactive protein    Standing Status:   Future    Standing Expiration Date:   09/03/2023   Hepatitis B core antibody, total    Standing Status:   Future    Standing Expiration Date:   09/03/2023   Hepatitis B surface antibody    Standing Status:   Future     Standing Expiration Date:   09/03/2023   Hepatitis B surface antigen    Standing Status:   Future    Standing Expiration Date:   09/03/2023   HIV antibody (with reflex)    Standing Status:   Future    Standing Expiration Date:   09/03/2023   Vitamin B12    Standing Status:   Future    Standing Expiration Date:   09/03/2023   Folate, Serum    Standing Status:   Future    Standing Expiration Date:   09/03/2023   TSH    Standing Status:   Future    Standing Expiration Date:   09/04/2023    All questions were answered. The patient knows to call the clinic with any problems, questions or concerns.  A total of more than 60 minutes were spent on this encounter with face-to-face time and non-face-to-face time, including preparing to see the patient, ordering tests and/or medications, counseling the patient and coordination of care as outlined above.   Ledell Peoples, MD Department of Hematology/Oncology [place] at [facility] Phone: [phone] Pager: [phone] Email: [email or web address]  09/03/2022 9:46 AM

## 2022-09-24 ENCOUNTER — Other Ambulatory Visit: Payer: Self-pay | Admitting: General Surgery

## 2022-09-24 DIAGNOSIS — Z9189 Other specified personal risk factors, not elsewhere classified: Secondary | ICD-10-CM

## 2022-09-24 DIAGNOSIS — R9230 Dense breasts, unspecified: Secondary | ICD-10-CM

## 2022-09-24 DIAGNOSIS — Z803 Family history of malignant neoplasm of breast: Secondary | ICD-10-CM

## 2022-10-12 ENCOUNTER — Ambulatory Visit
Admission: RE | Admit: 2022-10-12 | Discharge: 2022-10-12 | Disposition: A | Discharge status: Home / Self Care | Payer: Managed Care, Other (non HMO) | Source: Ambulatory Visit | Attending: General Surgery | Admitting: General Surgery

## 2022-10-12 DIAGNOSIS — Z9189 Other specified personal risk factors, not elsewhere classified: Secondary | ICD-10-CM

## 2022-10-12 DIAGNOSIS — R9230 Dense breasts, unspecified: Secondary | ICD-10-CM

## 2022-10-12 DIAGNOSIS — Z803 Family history of malignant neoplasm of breast: Secondary | ICD-10-CM

## 2022-10-12 MED ORDER — GADOPICLENOL 0.5 MMOL/ML IV SOLN
8.0000 mL | Freq: Once | INTRAVENOUS | Status: AC | PRN
  Administered 2022-10-12: 8 mL via INTRAVENOUS

## 2022-11-11 ENCOUNTER — Other Ambulatory Visit: Payer: Self-pay | Admitting: Physician Assistant

## 2022-11-11 ENCOUNTER — Ambulatory Visit
Admission: RE | Admit: 2022-11-11 | Discharge: 2022-11-11 | Disposition: A | Discharge status: Home / Self Care | Payer: Managed Care, Other (non HMO) | Source: Ambulatory Visit | Attending: Physician Assistant | Admitting: Physician Assistant

## 2022-11-11 DIAGNOSIS — M545 Low back pain, unspecified: Secondary | ICD-10-CM

## 2023-06-26 ENCOUNTER — Other Ambulatory Visit: Payer: Self-pay | Admitting: Family Medicine

## 2023-06-26 ENCOUNTER — Ambulatory Visit
Admission: RE | Admit: 2023-06-26 | Discharge: 2023-06-26 | Disposition: A | Discharge status: Home / Self Care | Payer: Managed Care, Other (non HMO) | Source: Ambulatory Visit | Attending: Family Medicine | Admitting: Family Medicine

## 2023-06-26 DIAGNOSIS — M25511 Pain in right shoulder: Secondary | ICD-10-CM
